# Patient Record
Sex: Male | Born: 1970 | Race: White | Hispanic: No | Marital: Married
Health system: Midwestern US, Community
[De-identification: ages and names within clinical notes are randomized; demographics above are authoritative.]

---

## 2017-07-29 ENCOUNTER — Inpatient Hospital Stay: Admit: 2017-07-29 | Discharge: 2017-07-29 | Payer: PRIVATE HEALTH INSURANCE

## 2017-07-29 DIAGNOSIS — B349 Viral infection, unspecified: Secondary | ICD-10-CM

## 2017-07-29 NOTE — ED Provider Notes (Signed)
[facility]  Urgent Care Encounter      CHIEFCOMPLAINT       Chief Complaint   Patient presents with   . Sinusitis     with drainage/cough   . Ear Fullness       Nurses Notes reviewed and I agree except as noted in the HPI.  HISTORY OF PRESENT ILLNESS   Paul Carey is a 46 y.o. male who presents:  To the urgent care for complaints of symptoms that started yesterday of sinus congestion, ear fullness, sore throat that is burning, clear rhinorrhea, fatigue, body aches.  He states he left work early yesterday and did not go to work today.  He states everybody at work is ill with similar symptoms and has been getting" shots".  He is here at a town for work.  He has not checked his temperature at home with a thermometer.  He states however he has felt warm.  He denies any shortness of breath, chest pain, wheezing, stridor, sinus pressure, cough.  Past medical history includes hyperlipidemia, hypertension.  He has taken ibuprofen for his symptoms with minimal relief.      The history is provided by the patient.       REVIEW OF SYSTEMS     Review of Systems   Constitutional: Positive for activity change and fatigue. Negative for appetite change, chills, diaphoresis and fever ("felt warm").   HENT: Positive for ear pain (bilateral ear fullness), rhinorrhea and sore throat. Negative for congestion, ear discharge, postnasal drip, sinus pressure, trouble swallowing and voice change.    Respiratory: Negative for cough, chest tightness, shortness of breath, wheezing and stridor.    Gastrointestinal: Negative for abdominal pain, diarrhea, nausea and vomiting.   Musculoskeletal: Positive for myalgias.   Skin: Negative for pallor and rash.   Allergic/Immunologic: Negative for environmental allergies.   Neurological: Negative for headaches.       PAST MEDICAL HISTORY         Diagnosis Date   . Hyperlipidemia    . Hypertension        SURGICAL HISTORY     Patient  has no past surgical history on  file.    CURRENT MEDICATIONS       Current Discharge Medication List      CONTINUE these medications which have NOT CHANGED    Details   UNKNOWN TO PATIENT       ATORVASTATIN CALCIUM PO Take by mouth             ALLERGIES     Patient is has No Known Allergies.    FAMILY HISTORY     Patient's family history is not on file.    SOCIAL HISTORY     Patient  reports that he has never smoked. He uses smokeless tobacco. He reports that he does not drink alcohol.    PHYSICAL EXAM     ED TRIAGE VITALS  BP: 129/89, Temp: 98.3 F (36.8 C), Pulse: 84, Resp: 16, SpO2: 95 %  Physical Exam   Constitutional: He is oriented to person, place, and time. Vital signs are normal. He appears well-developed and well-nourished.  Non-toxic appearance. He does not have a sickly appearance. He does not appear ill. No distress.   HENT:   Head: Normocephalic and atraumatic. Head is without right periorbital erythema and without left periorbital erythema.   Right Ear: Hearing, tympanic membrane, external ear and ear canal normal.   Left Ear: Hearing, tympanic membrane,  external ear and ear canal normal.   Nose: Nose normal. No mucosal edema or rhinorrhea. Right sinus exhibits no maxillary sinus tenderness and no frontal sinus tenderness. Left sinus exhibits no maxillary sinus tenderness and no frontal sinus tenderness.   Mouth/Throat: Uvula is midline, oropharynx is clear and moist and mucous membranes are normal. No trismus in the jaw. No uvula swelling. No oropharyngeal exudate, posterior oropharyngeal edema, posterior oropharyngeal erythema or tonsillar abscesses. Tonsils are 2+ on the right. Tonsils are 2+ on the left. No tonsillar exudate.       Neck: Normal range of motion. Neck supple.   Cardiovascular: Normal rate, regular rhythm, S1 normal, S2 normal and normal heart sounds.  Exam reveals no gallop, no S3, no S4, no distant heart sounds and no friction rub.    No murmur heard.  Pulmonary/Chest: Effort normal and breath sounds normal. No  accessory muscle usage or stridor. No respiratory distress. He has no decreased breath sounds. He has no wheezes. He has no rhonchi. He has no rales.   Lymphadenopathy:     He has no cervical adenopathy.   Neurological: He is alert and oriented to person, place, and time.   Skin: Skin is warm, dry and intact. Capillary refill takes less than 2 seconds. No rash noted. He is not diaphoretic. No cyanosis. No pallor.   Nursing note and vitals reviewed.      DIAGNOSTIC RESULTS   Labs:No results found for this visit on 07/29/17.    IMAGING:    URGENT CARE COURSE:     Vitals:    07/29/17 0935   BP: 129/89   Pulse: 84   Resp: 16   Temp: 98.3 F (36.8 C)   TempSrc: Oral   SpO2: 95%   Weight: 200 lb (90.7 kg)   Height:  (1.778 m)       Medications - No data to display  PROCEDURES:  None  FINAL IMPRESSION      1. Viral illness        DISPOSITION/PLAN   DISPOSITION Eloped - Left Before Treatment Complete 07/29/2017 10:01:53 AM  Noted post nasal drip on exam  Lungs clear throughout  Otherwise normal exam  Vitals normal  Alert and oriented  From out of town on contract work at [employer]   Has been exposed to ill contacts at work.  Symptoms of illness began yesterday.  He has taken ibuprofen for his symptoms.  Discussed with patient in room with A influenza swab if influenza negative with prescribed medications to treat symptoms.  Patient rose from chair and stated that he was going to leave and go to the drug store and get over-the-counter medications and go home because he did not feel well and declined diagnostic treatment.  Patient left before treatment was complete  PATIENT REFERRED TO:  No follow-up provider specified.    DISCHARGE MEDICATIONS:  Current Discharge Medication List        Current Discharge Medication List              Ethlyn Gallery, APRN - CNP       Ethlyn Gallery, APRN - CNP  07/29/17 1017

## 2017-07-29 NOTE — ED Triage Notes (Signed)
Ambulated to room #8

## 2020-09-23 IMAGING — US US LIVER
1 series · 14 of 25 positions shown · non-contrast
Comparison: None.

INDICATION: 49 year-old male with abnormal results of liver function studies.
TECHNIQUE: Using real-time and Doppler ultrasound, the liver and gallbladder were evaluated.

[Series 1: us liver · 14 of 38 slices shown]
[im 1/38]
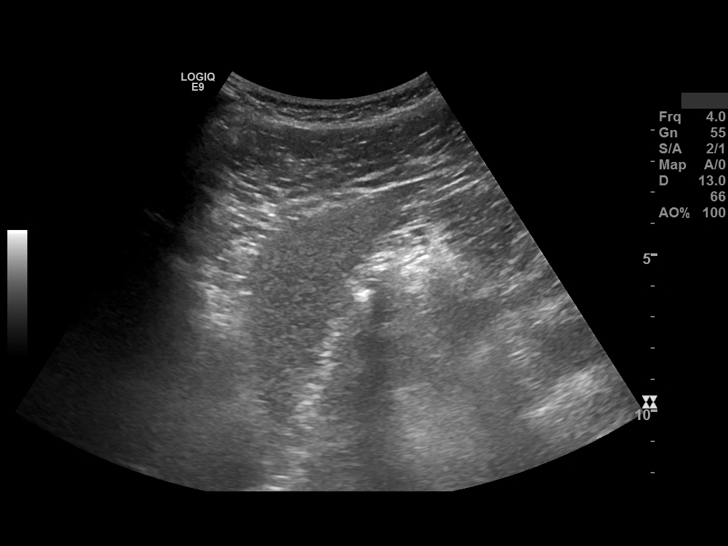
[im 4/38]
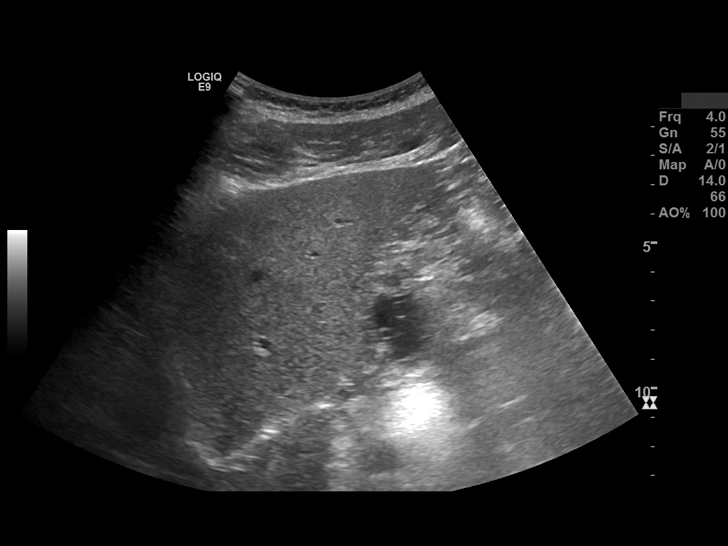
[im 7/38]
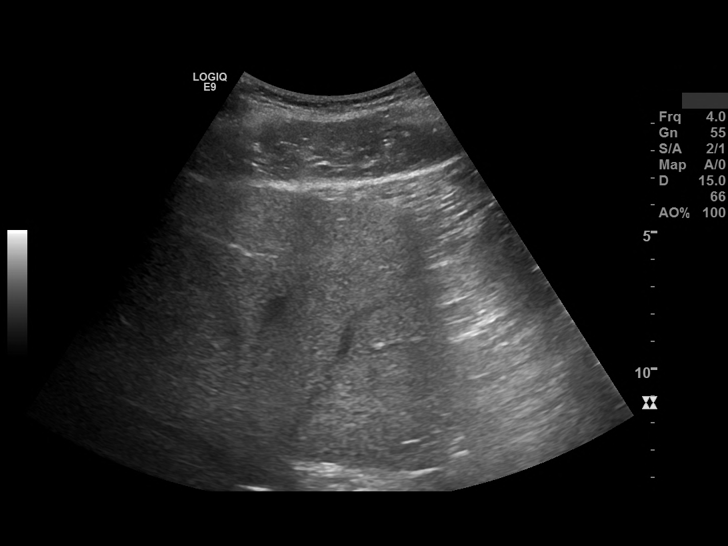
[im 10/38]
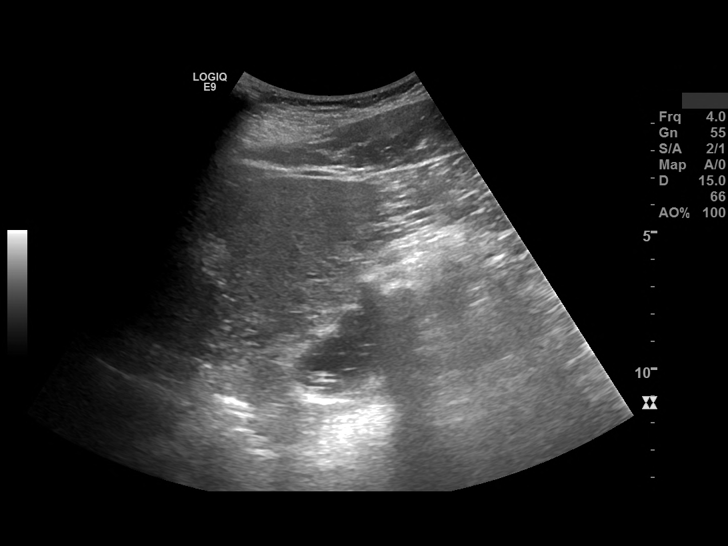
[im 13/38]
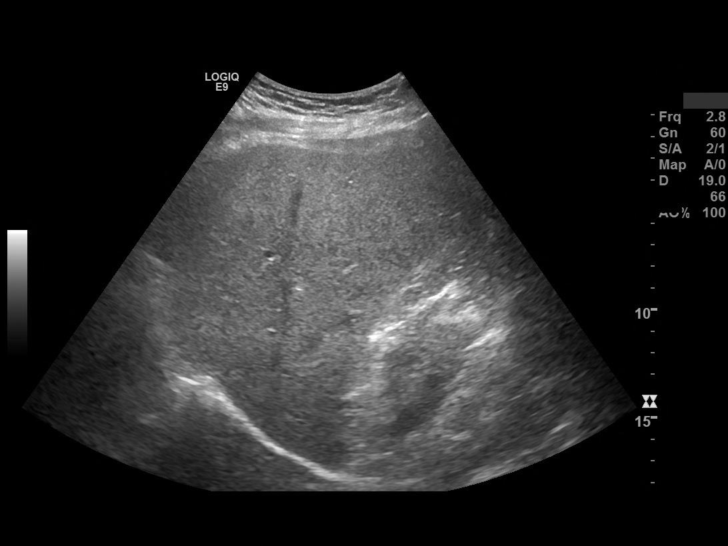
[im 14/38]
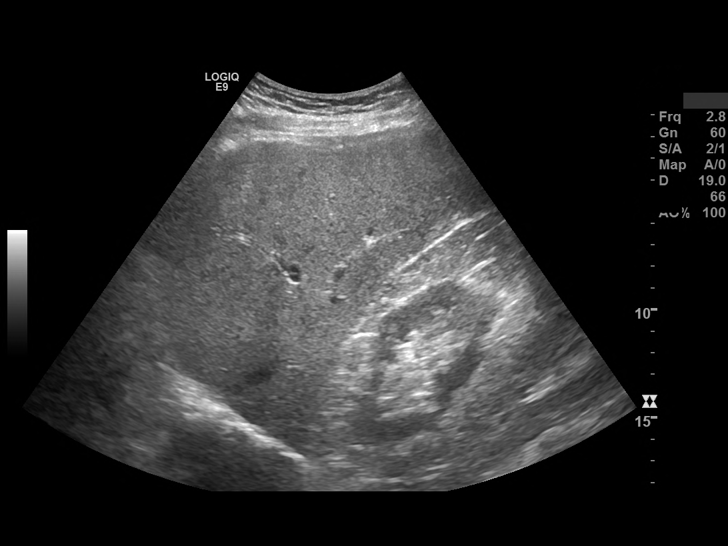
[im 17/38]
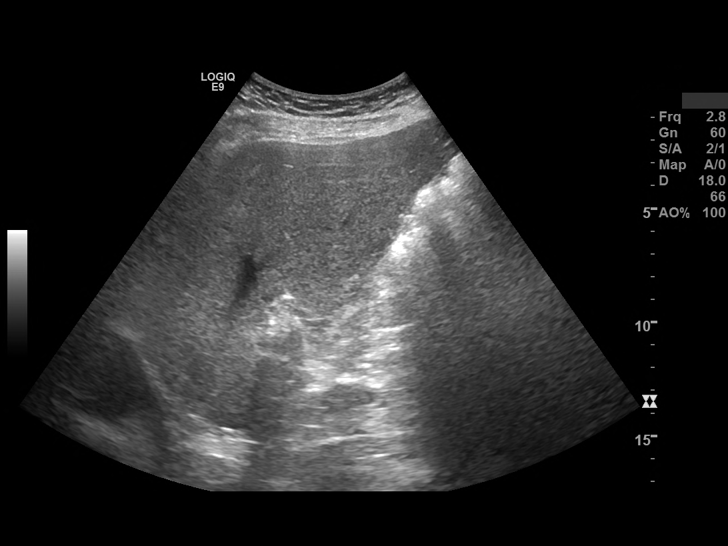
[im 21/38]
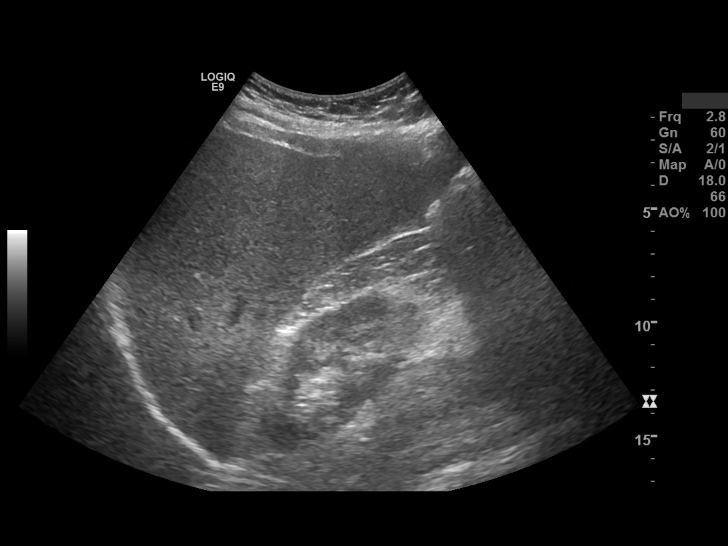
[im 24/38]
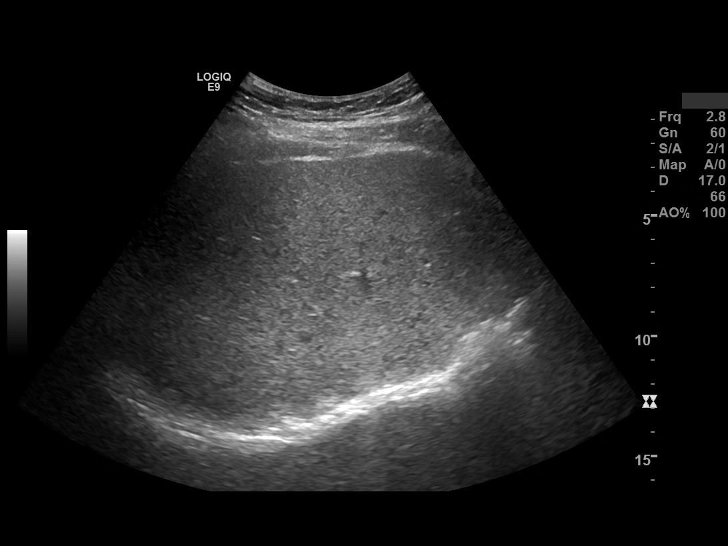
[im 25/38]
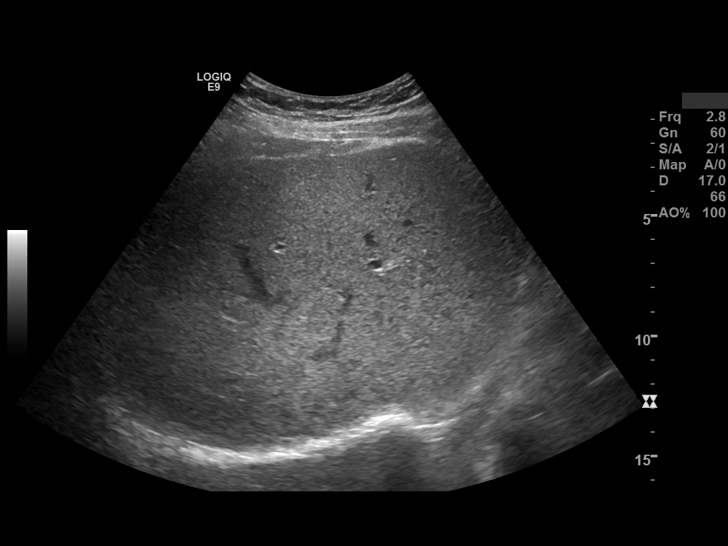
[im 28/38]
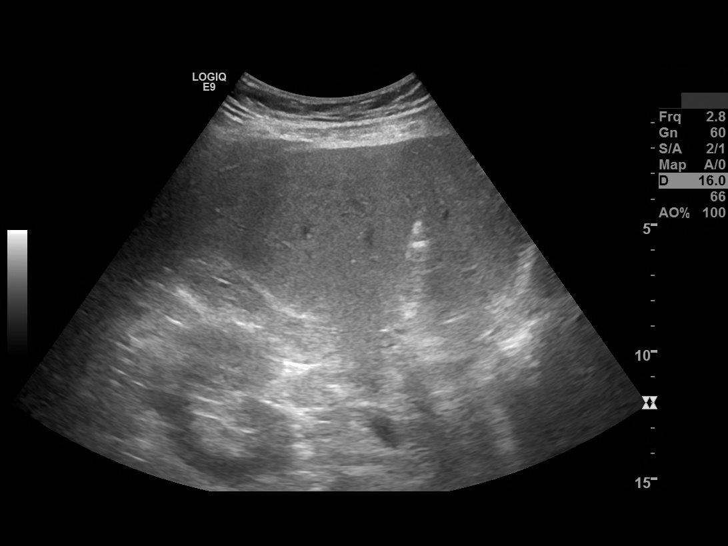
[im 31/38]
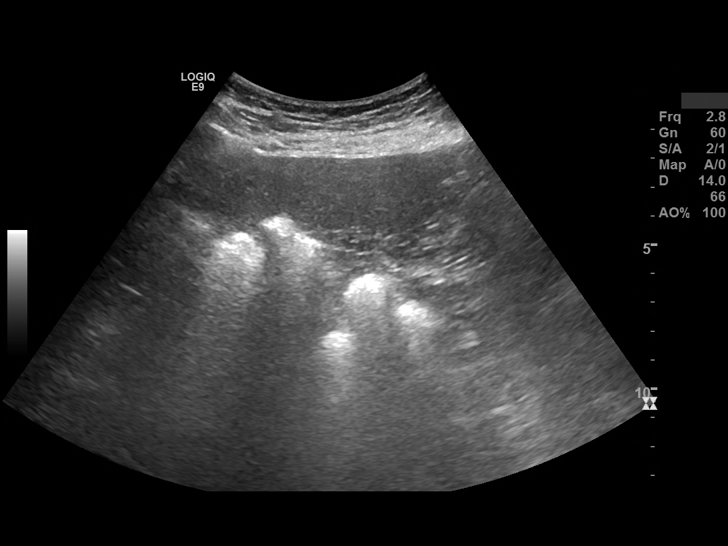
[im 34/38]
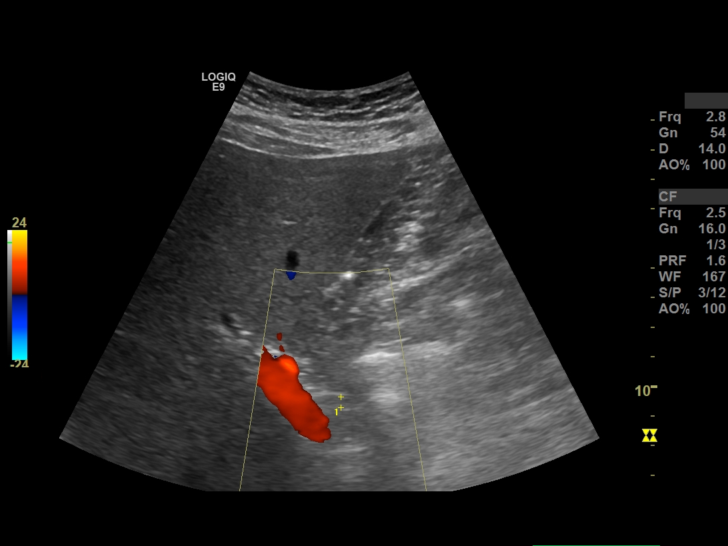
[im 38/38]
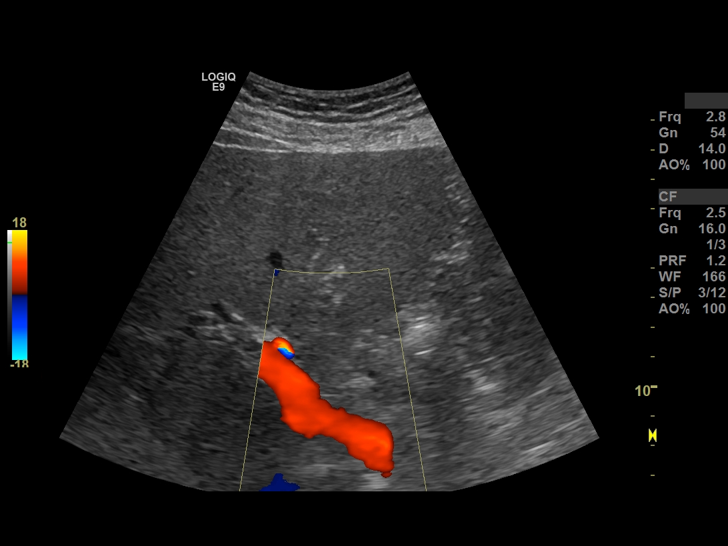

[14 of 25 positions shown; findings below may reference images not displayed]

FINDINGS: Liver: Length of  164 mm in the right midclavicular line. The liver is in the upper limits of normal for size with diffuse increased echotexture which may be due to fatty liver infiltration versus hepatocellular disease. Hepatopetal flow is identified in the portal vein. No focal masses identified. No intrahepatic biliary ductal dilatation is seen. 

Gallbladder the gallbladder is surgically absent. The common bile duct measures 3.60  mm.

No evidence of ascites is seen.
IMPRESSION: 1. Normal liver in size with diffuse increased echotexture of the liver which may be due to fatty liver infiltration versus hepatocellular disease.

2. Previous cholecystectomy with no intra- or extrahepatic biliary ductal dilatation.

3. No evidence of ascites.
